# Patient Record
Sex: Male | Born: 1965 | Race: Black or African American | Hispanic: No | Marital: Single | Smoking: Never smoker
Health system: Southern US, Community
[De-identification: ages and names within clinical notes are randomized; demographics above are authoritative.]

## PROBLEM LIST (undated history)

## (undated) DIAGNOSIS — J45909 Unspecified asthma, uncomplicated: Secondary | ICD-10-CM

## (undated) DIAGNOSIS — T7840XA Allergy, unspecified, initial encounter: Secondary | ICD-10-CM

## (undated) DIAGNOSIS — F329 Major depressive disorder, single episode, unspecified: Secondary | ICD-10-CM

## (undated) DIAGNOSIS — F32A Depression, unspecified: Secondary | ICD-10-CM

## (undated) DIAGNOSIS — G43909 Migraine, unspecified, not intractable, without status migrainosus: Secondary | ICD-10-CM

## (undated) HISTORY — DX: Major depressive disorder, single episode, unspecified: F32.9

## (undated) HISTORY — PX: OTHER SURGICAL HISTORY: SHX169

## (undated) HISTORY — DX: Unspecified asthma, uncomplicated: J45.909

## (undated) HISTORY — DX: Depression, unspecified: F32.A

## (undated) HISTORY — DX: Allergy, unspecified, initial encounter: T78.40XA

## (undated) HISTORY — DX: Migraine, unspecified, not intractable, without status migrainosus: G43.909

## (undated) HISTORY — PX: ACHILLES TENDON REPAIR: SUR1153

---

## 2008-12-13 ENCOUNTER — Emergency Department (HOSPITAL_COMMUNITY): Admission: EM | Admit: 2008-12-13 | Discharge: 2008-12-13 | Payer: Self-pay | Admitting: Emergency Medicine

## 2009-12-06 ENCOUNTER — Ambulatory Visit: Payer: Self-pay | Admitting: Internal Medicine

## 2009-12-19 ENCOUNTER — Inpatient Hospital Stay: Payer: Self-pay | Admitting: Student

## 2010-01-06 ENCOUNTER — Ambulatory Visit: Payer: Self-pay | Admitting: Internal Medicine

## 2010-03-07 ENCOUNTER — Emergency Department (HOSPITAL_COMMUNITY): Admission: EM | Admit: 2010-03-07 | Discharge: 2010-03-07 | Payer: Self-pay | Admitting: Emergency Medicine

## 2010-09-14 IMAGING — CR DG ANKLE COMPLETE 3+V*R*
3 series · 3 of 3 positions shown · non-contrast
Comparison: None.

CLINICAL DATA: Right ankle pain.  Status post fall on stairs.

RIGHT ANKLE - COMPLETE 3+ VIEW

[t ankle joint ap right]
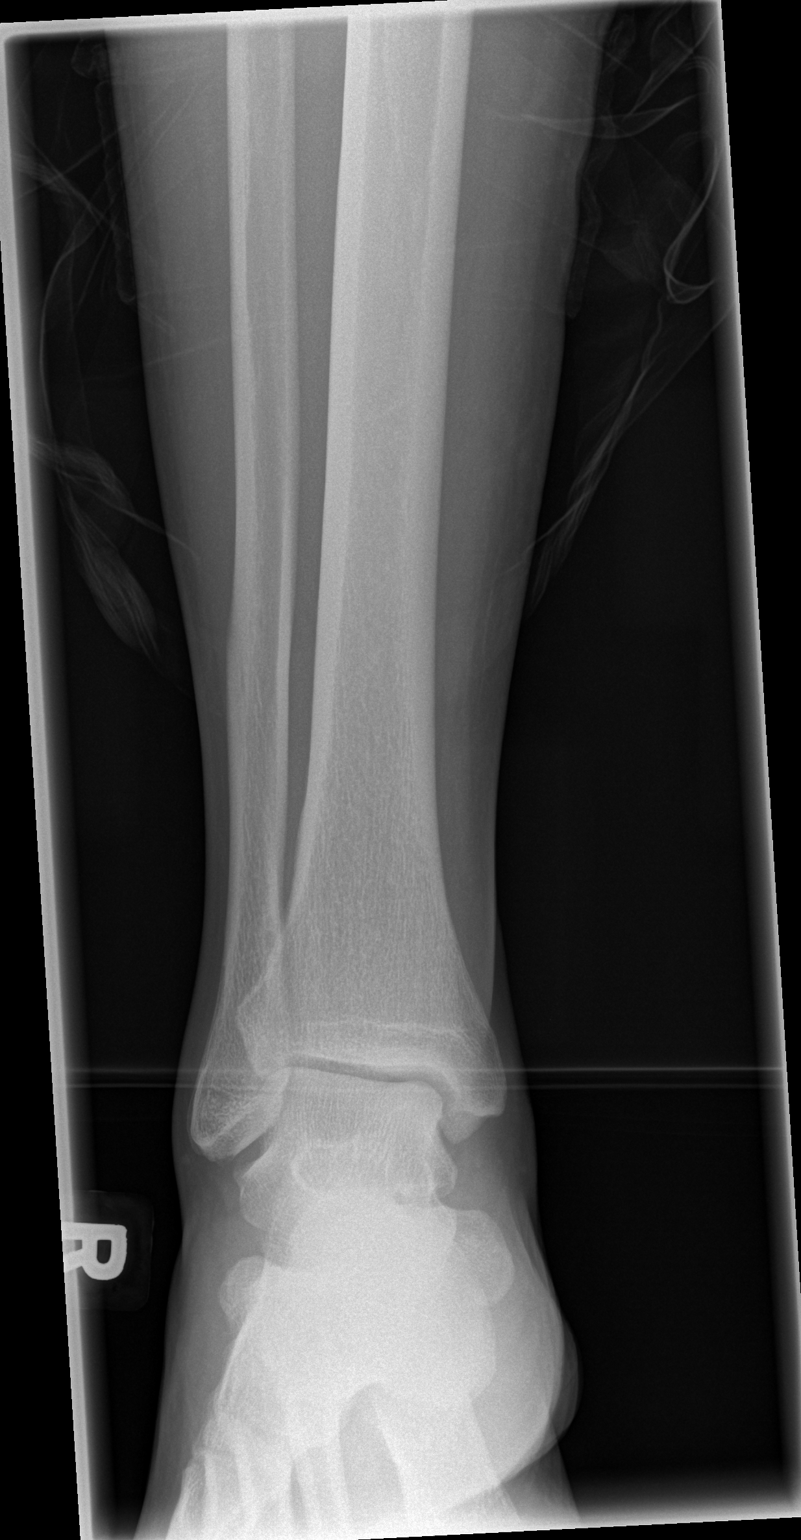

[t ankle joint oblique right]
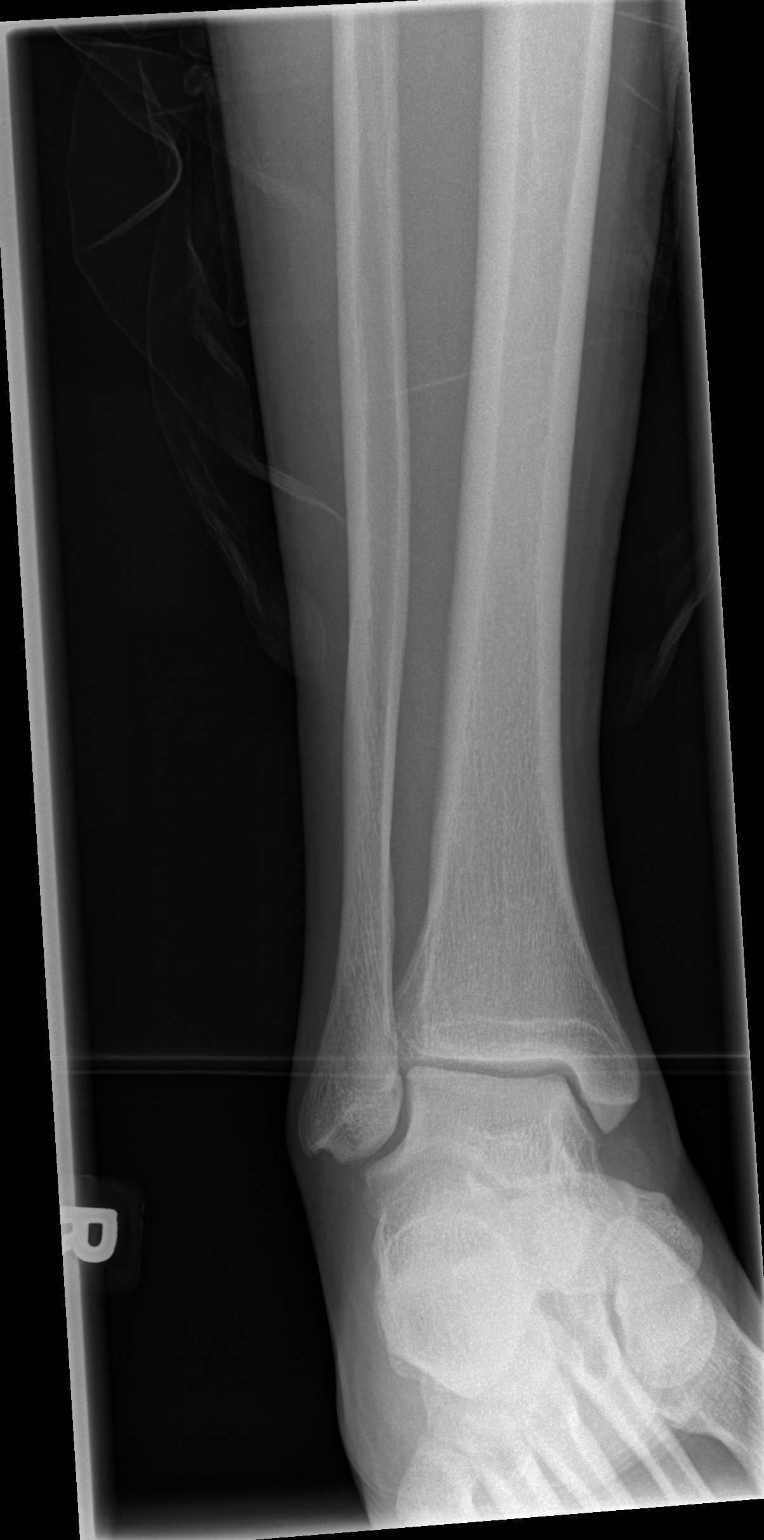

[t ankle joint lat right]
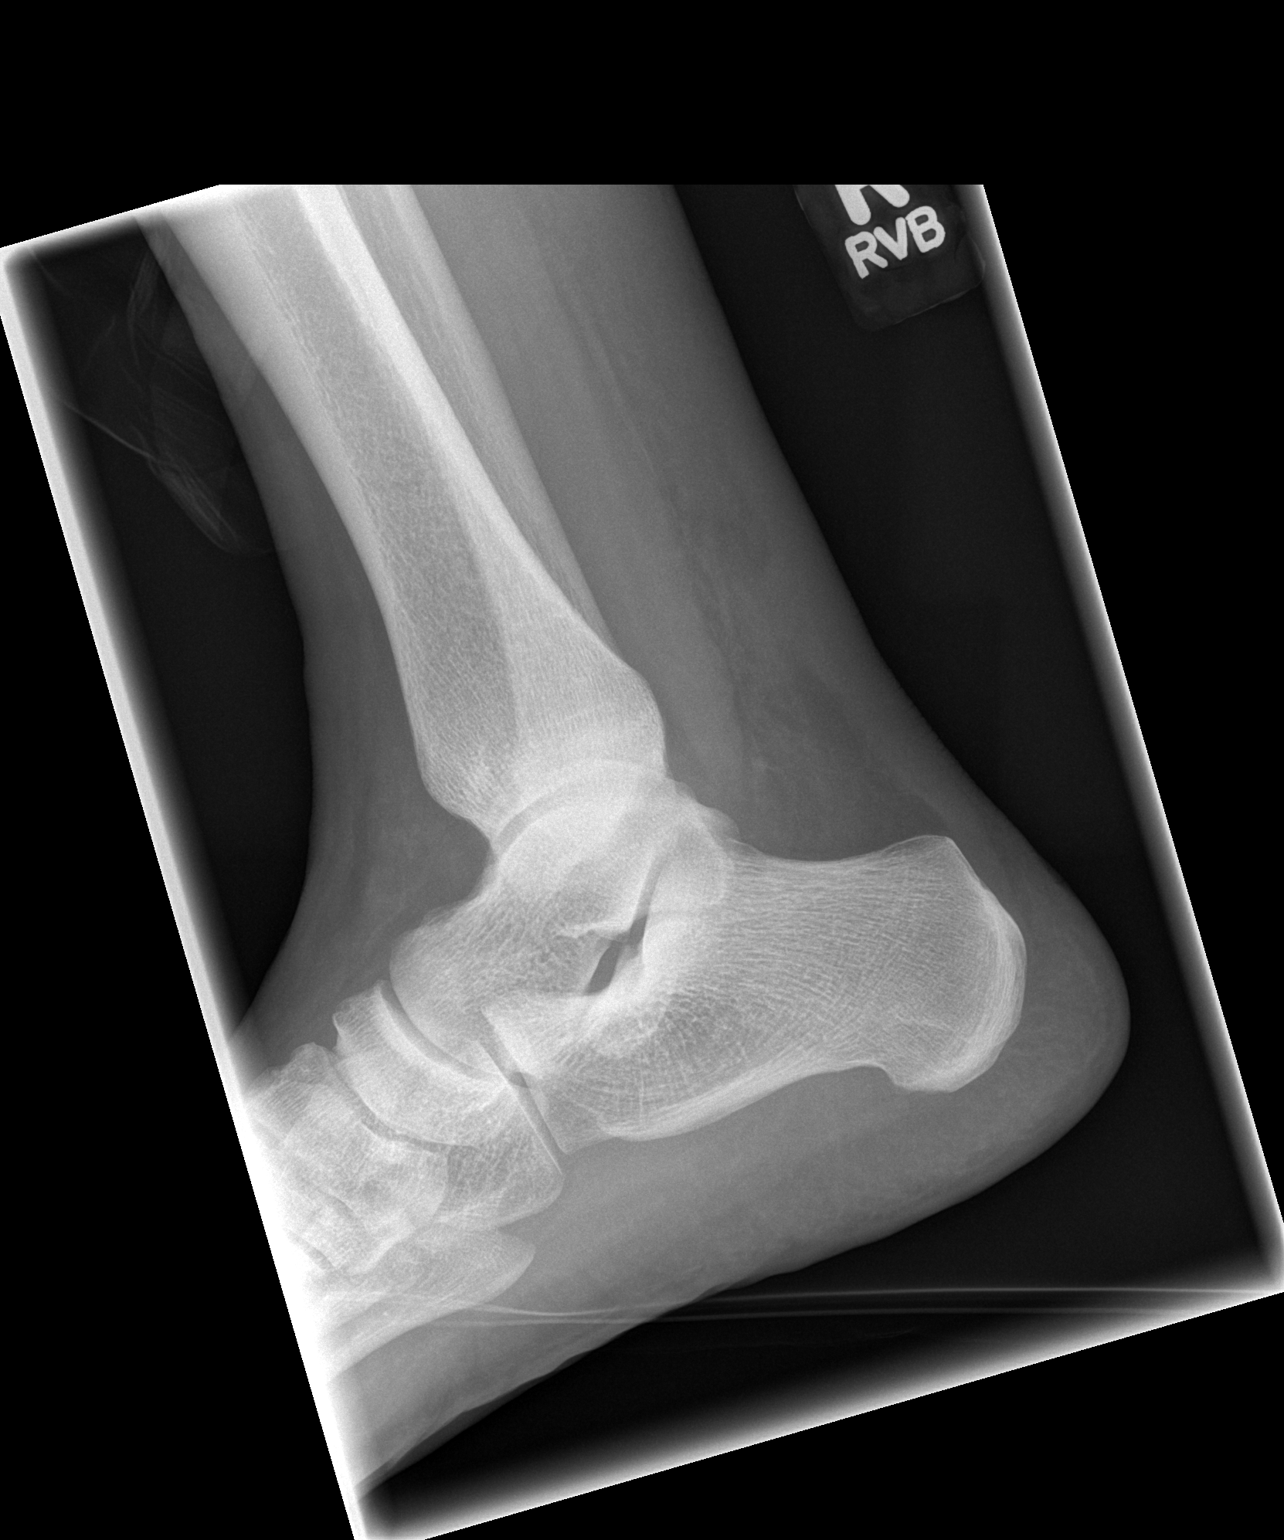

[3 of 3 positions shown; findings below may reference images not displayed]

FINDINGS: Mild soft tissue swelling is noted over the lateral
malleolus.  There is no underlying fracture.  The joint is intact.
No significant joint effusion is present.
IMPRESSION: Minimal soft tissue swelling over lateral malleolus without
underlying fracture or subluxation.

## 2012-11-24 ENCOUNTER — Ambulatory Visit (INDEPENDENT_AMBULATORY_CARE_PROVIDER_SITE_OTHER): Payer: 59 | Admitting: Emergency Medicine

## 2012-11-24 VITALS — BP 128/80 | HR 78 | Temp 97.9°F | Resp 16 | Ht 71.0 in | Wt 225.6 lb

## 2012-11-24 DIAGNOSIS — B001 Herpesviral vesicular dermatitis: Secondary | ICD-10-CM

## 2012-11-24 DIAGNOSIS — B009 Herpesviral infection, unspecified: Secondary | ICD-10-CM

## 2012-11-24 DIAGNOSIS — Z0000 Encounter for general adult medical examination without abnormal findings: Secondary | ICD-10-CM

## 2012-11-24 LAB — POCT URINALYSIS DIPSTICK
Glucose, UA: NEGATIVE
Ketones, UA: NEGATIVE
Leukocytes, UA: NEGATIVE
Spec Grav, UA: 1.01
Urobilinogen, UA: 0.2

## 2012-11-24 LAB — TSH: TSH: 1.299 u[IU]/mL (ref 0.350–4.500)

## 2012-11-24 LAB — COMPREHENSIVE METABOLIC PANEL
Alkaline Phosphatase: 57 U/L (ref 39–117)
CO2: 23 mEq/L (ref 19–32)
Calcium: 9.3 mg/dL (ref 8.4–10.5)
Glucose, Bld: 100 mg/dL — ABNORMAL HIGH (ref 70–99)
Potassium: 4.3 mEq/L (ref 3.5–5.3)
Sodium: 138 mEq/L (ref 135–145)

## 2012-11-24 LAB — POCT CBC
Lymph, poc: 1.7 (ref 0.6–3.4)
MID (cbc): 0.5 (ref 0–0.9)
POC LYMPH PERCENT: 42.7 %L (ref 10–50)
WBC: 3.9 10*3/uL — AB (ref 4.6–10.2)

## 2012-11-24 LAB — POCT UA - MICROSCOPIC ONLY
Casts, Ur, LPF, POC: NEGATIVE
Mucus, UA: NEGATIVE
RBC, urine, microscopic: NEGATIVE
WBC, Ur, HPF, POC: NEGATIVE

## 2012-11-24 LAB — IFOBT (OCCULT BLOOD): IFOBT: NEGATIVE

## 2012-11-24 LAB — LIPID PANEL
HDL: 56 mg/dL (ref >39)
Triglycerides: 64 mg/dL (ref <150)

## 2012-11-24 MED ORDER — VALACYCLOVIR HCL 1 G PO TABS: 2000.0000 mg | ORAL_TABLET | Freq: Two times a day (BID) | ORAL | Status: DC

## 2012-11-24 NOTE — Progress Notes (Signed)
[facility] [address], [place] [address] [phone]  Date:  11/24/2012   Name:  Shannon Mclean   DOB:  [birth year]   MRN:  [id number]  PCP:  No primary provider on file.    Chief Complaint: Annual Exam   History of Present Illness:  Shannon Mclean is a 46 y.o. very pleasant male patient who presents with the following:  History of migraine headaches with diminishing frequency now occuring weekly.  No improvement with maxalt and suffered adverse side effects of medication.  There is no problem list on file for this patient.   Past Medical History  Diagnosis Date  . Migraines     Past Surgical History  Procedure Date  . Achil   . Achilles tendon repair     History  Substance Use Topics  . Smoking status: Never Smoker   . Smokeless tobacco: Not on file  . Alcohol Use: 0.5 oz/week    1 drink(s) per week    Family History  Problem Relation Age of Onset  . Alzheimer's disease Mother   . Diabetes Father     No Known Allergies  Medication list has been reviewed and updated.  No current outpatient prescriptions on file prior to visit.    Review of Systems:  As per HPI, otherwise negative.    Physical Examination: Filed Vitals:   11/24/12 0821  BP: 128/80  Pulse: 78  Temp: 97.9 F (36.6 C)  Resp: 16   Filed Vitals:   11/24/12 0821  Height: 5\' 11"  (1.803 m)  Weight: 225 lb 9.6 oz (102.331 kg)   Body mass index is 31.46 kg/(m^2). Ideal Body Weight: Weight in (lb) to have BMI = 25: 178.9   GEN: WDWN, NAD, Non-toxic, A & O x 3 HEENT: Atraumatic, Normocephalic. Neck supple. No masses, No LAD. Ears and Nose: No external deformity. CV: RRR, No M/G/R. No JVD. No thrill. No extra heart sounds. PULM: CTA B, no wheezes, crackles, rhonchi. No retractions. No resp. distress. No accessory muscle use. ABD: S, NT, ND, +BS. No rebound. No HSM. EXTR: No c/c/e NEURO Normal gait.  PSYCH: Normally interactive. Conversant. Not  depressed or anxious appearing.  Calm demeanor.  RECTAL:  Normal male  Assessment and Plan: Wellness exam Migraine headaches controlled with NSAID Labs Follow up based on labs Refused flu shot  Carmelina Dane, MD  Results for orders placed in visit on 11/24/12  POCT CBC      Component Value Range   WBC 3.9 (*) 4.6 - 10.2 K/uL   Lymph, poc 1.7  0.6 - 3.4   POC LYMPH PERCENT 42.7  10 - 50 %L   MID (cbc) 0.5  0 - 0.9   POC MID % 12.1 (*) 0 - 12 %M   POC Granulocyte 1.8 (*) 2 - 6.9   Granulocyte percent 45.2  37 - 80 %G   RBC 5.47  4.69 - 6.13 M/uL   Hemoglobin 14.9  14.1 - 18.1 g/dL   HCT, POC 29.5  62.1 - 53.7 %   MCV 87.8  80 - 97 fL   MCH, POC 27.2  27 - 31.2 pg   MCHC 31.0 (*) 31.8 - 35.4 g/dL   RDW, POC 30.8     Platelet Count, POC 134 (*) 142 - 424 K/uL   MPV 12.2  0 - 99.8 fL  POCT UA - MICROSCOPIC ONLY      Component Value Range   WBC,  Ur, HPF, POC neg     RBC, urine, microscopic neg     Bacteria, U Microscopic neg     Mucus, UA neg     Epithelial cells, urine per micros rare     Crystals, Ur, HPF, POC neg     Casts, Ur, LPF, POC neg     Yeast, UA neg    POCT URINALYSIS DIPSTICK      Component Value Range   Color, UA yellow     Clarity, UA clear     Glucose, UA neg     Bilirubin, UA neg     Ketones, UA neg     Spec Grav, UA 1.010     Blood, UA trace-intact     pH, UA 7.0     Protein, UA neg     Urobilinogen, UA 0.2     Nitrite, UA neg     Leukocytes, UA Negative    IFOBT (OCCULT BLOOD)      Component Value Range   IFOBT Negative

## 2012-11-25 LAB — VITAMIN D 25 HYDROXY (VIT D DEFICIENCY, FRACTURES): Vit D, 25-Hydroxy: 21 ng/mL — ABNORMAL LOW (ref 30–89)

## 2012-11-27 MED ORDER — VITAMIN D (ERGOCALCIFEROL) 1.25 MG (50000 UNIT) PO CAPS: 50000.0000 [IU] | ORAL_CAPSULE | ORAL | Status: DC

## 2012-12-01 NOTE — Progress Notes (Signed)
Completed prior auth for pt's Vit D Rx and received approval from 11/30/12 - 11/30/13. Faxed approval notice to pharmacy.

## 2013-04-02 ENCOUNTER — Ambulatory Visit (INDEPENDENT_AMBULATORY_CARE_PROVIDER_SITE_OTHER): Payer: 59 | Admitting: Family Medicine

## 2013-04-02 VITALS — BP 128/92 | HR 63 | Temp 97.4°F | Resp 16 | Ht 72.0 in | Wt 232.0 lb

## 2013-04-02 DIAGNOSIS — B009 Herpesviral infection, unspecified: Secondary | ICD-10-CM

## 2013-04-02 DIAGNOSIS — J302 Other seasonal allergic rhinitis: Secondary | ICD-10-CM

## 2013-04-02 DIAGNOSIS — E559 Vitamin D deficiency, unspecified: Secondary | ICD-10-CM

## 2013-04-02 DIAGNOSIS — J309 Allergic rhinitis, unspecified: Secondary | ICD-10-CM

## 2013-04-02 MED ORDER — FLUTICASONE PROPIONATE 50 MCG/ACT NA SUSP: 2.0000 | Freq: Every day | NASAL | Status: DC

## 2013-04-02 MED ORDER — ALBUTEROL SULFATE HFA 108 (90 BASE) MCG/ACT IN AERS: 2.0000 | INHALATION_SPRAY | RESPIRATORY_TRACT | Status: DC | PRN

## 2013-04-02 MED ORDER — AZELASTINE HCL 0.1 % NA SOLN: 2.0000 | Freq: Two times a day (BID) | NASAL | Status: DC

## 2013-04-02 MED ORDER — LEVOCETIRIZINE DIHYDROCHLORIDE 5 MG PO TABS: 5.0000 mg | ORAL_TABLET | Freq: Every evening | ORAL | Status: DC

## 2013-04-02 NOTE — Patient Instructions (Addendum)
Try a Netti pot for the sinus rinse use nasal spray every day, will be 2 weeks for effect. Use inhaler as needed if you feel wheezing Allergic Rhinitis Allergic rhinitis is when the mucous membranes in the nose respond to allergens. Allergens are particles in the air that cause your body to have an allergic reaction. This causes you to release allergic antibodies. Through a chain of events, these eventually cause you to release histamine into the blood stream (hence the use of antihistamines). Although meant to be protective to the body, it is this release that causes your discomfort, such as frequent sneezing, congestion and an itchy runny nose.  CAUSES  The pollen allergens may come from grasses, trees, and weeds. This is seasonal allergic rhinitis, or "hay fever." Other allergens cause year-round allergic rhinitis (perennial allergic rhinitis) such as house dust mite allergen, pet dander and mold spores.  SYMPTOMS   Nasal stuffiness (congestion).  Runny, itchy nose with sneezing and tearing of the eyes.  There is often an itching of the mouth, eyes and ears. It cannot be cured, but it can be controlled with medications. DIAGNOSIS  If you are unable to determine the offending allergen, skin or blood testing may find it. TREATMENT   Avoid the allergen.  Medications and allergy shots (immunotherapy) can help.  Hay fever may often be treated with antihistamines in pill or nasal spray forms. Antihistamines block the effects of histamine. There are over-the-counter medicines that may help with nasal congestion and swelling around the eyes. Check with your caregiver before taking or giving this medicine. If the treatment above does not work, there are many new medications your caregiver can prescribe. Stronger medications may be used if initial measures are ineffective. Desensitizing injections can be used if medications and avoidance fails. Desensitization is when a patient is given ongoing shots  until the body becomes less sensitive to the allergen. Make sure you follow up with your caregiver if problems continue. SEEK MEDICAL CARE IF:   You develop fever (more than 100.5 F (38.1 C).  You develop a cough that does not stop easily (persistent).  You have shortness of breath.  You start wheezing.  Symptoms interfere with normal daily activities. Document Released: 08/17/2001 Document Revised: 02/14/2012 Document Reviewed: 02/26/2009 [facility] Patient Information 2013 [place], [place].

## 2013-04-02 NOTE — Progress Notes (Signed)
  Subjective:    Patient ID: Shannon Mclean, male    DOB: [birth year], 47 y.o.   MRN: [id number]   Chief Complaint  Patient presents with  . Allergies    HPI Works outside so taking claritin-D and zyrtec but not helping.  Having itchy watery eyes, sneezing, wheezing, can't breath though nose or mouth.  No throat or ear pain, is having sinus pressure.  Does saline rinses at times w/o benefit.    Review of Systems    BP 128/92  Pulse 63  Temp(Src) 97.4 F (36.3 C) (Oral)  Resp 16  Ht 6' (1.829 m)  Wt 232 lb (105.235 kg)  BMI 31.46 kg/m2  SpO2 100%predicted peak flow is 640 Objective:   Physical Exam        Assessment & Plan:

## 2013-04-03 LAB — VITAMIN D 25 HYDROXY (VIT D DEFICIENCY, FRACTURES): Vit D, 25-Hydroxy: 37 ng/mL (ref 30–89)

## 2013-06-29 ENCOUNTER — Other Ambulatory Visit: Payer: Self-pay

## 2013-06-29 MED ORDER — LEVOCETIRIZINE DIHYDROCHLORIDE 5 MG PO TABS: 5.0000 mg | ORAL_TABLET | Freq: Every evening | ORAL | Status: DC

## 2013-07-27 ENCOUNTER — Other Ambulatory Visit: Payer: Self-pay | Admitting: Family Medicine

## 2014-02-20 ENCOUNTER — Ambulatory Visit: Payer: 59

## 2014-02-20 ENCOUNTER — Ambulatory Visit (INDEPENDENT_AMBULATORY_CARE_PROVIDER_SITE_OTHER): Payer: 59 | Admitting: Family Medicine

## 2014-02-20 VITALS — BP 142/92 | HR 58 | Temp 98.3°F | Resp 17 | Ht 71.0 in | Wt 238.0 lb

## 2014-02-20 DIAGNOSIS — Z8619 Personal history of other infectious and parasitic diseases: Secondary | ICD-10-CM

## 2014-02-20 DIAGNOSIS — R1013 Epigastric pain: Secondary | ICD-10-CM

## 2014-02-20 DIAGNOSIS — K5900 Constipation, unspecified: Secondary | ICD-10-CM

## 2014-02-20 DIAGNOSIS — R110 Nausea: Secondary | ICD-10-CM

## 2014-02-20 DIAGNOSIS — R109 Unspecified abdominal pain: Secondary | ICD-10-CM

## 2014-02-20 LAB — POCT UA - MICROSCOPIC ONLY
Bacteria, U Microscopic: NEGATIVE
Casts, Ur, LPF, POC: NEGATIVE
Epithelial cells, urine per micros: NEGATIVE
Mucus, UA: NEGATIVE
RBC, urine, microscopic: NEGATIVE
WBC, Ur, HPF, POC: NEGATIVE
Yeast, UA: NEGATIVE

## 2014-02-20 LAB — POCT URINALYSIS DIPSTICK
Bilirubin, UA: NEGATIVE
Blood, UA: NEGATIVE
Glucose, UA: NEGATIVE
Ketones, UA: NEGATIVE
Leukocytes, UA: NEGATIVE
Nitrite, UA: NEGATIVE
Protein, UA: NEGATIVE
Spec Grav, UA: 1.015
Urobilinogen, UA: 0.2
pH, UA: 7

## 2014-02-20 LAB — POCT CBC
Granulocyte percent: 49.2 %G (ref 37–80)
HCT, POC: 47.8 % (ref 43.5–53.7)
Hemoglobin: 15.3 g/dL (ref 14.1–18.1)
Lymph, poc: 1.8 (ref 0.6–3.4)
MCH, POC: 27.6 pg (ref 27–31.2)
MCHC: 32 g/dL (ref 31.8–35.4)
MCV: 86.2 fL (ref 80–97)
MID (cbc): 0.4 (ref 0–0.9)
MPV: 15.5 fL (ref 0–99.8)
POC Granulocyte: 2.1 (ref 2–6.9)
POC LYMPH PERCENT: 41.9 %L (ref 10–50)
POC MID %: 8.9 %M (ref 0–12)
Platelet Count, POC: 139 10*3/uL — AB (ref 142–424)
RBC: 5.54 M/uL (ref 4.69–6.13)
RDW, POC: 14.7 %
WBC: 4.2 10*3/uL — AB (ref 4.6–10.2)

## 2014-02-20 MED ORDER — VALACYCLOVIR HCL 1 G PO TABS: 2000.0000 mg | ORAL_TABLET | Freq: Two times a day (BID) | ORAL | Status: DC

## 2014-02-20 NOTE — Progress Notes (Signed)
Chief Complaint:  Chief Complaint  Patient presents with  . Abdominal Pain  . Nausea  . Fatigue  . Medication Refill    valtrex    HPI:No  Shannon Mclean is a 48 y.o. male who is here for abdominal pain in the AM but has gotten more intense and lasting longers, 5-15 min last 12 days. Yesterday about 9/10 pain, stabbing pain and felt nauseated. Had to leave work. He thought it was because he was drinking oragne juice. He feels like he has to go to the bathroom. He felt it before and nothing happens. He]'  had a BM after work. No diarrhea or loose stools, no blood. Normal BM yesterday.  No fevers or chills. Has had fluids, food makes it better. No fevers, no vomiting. No chills. No untintentional weightloss. No night sweats. No acute back pain or urianry sxs.  He has been taking naproxen for HAs everyday. Everyday he has slight HA. He has been taking it regular.  He feels tired. He has been taking caffeine pills. 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  Does have some straining with BM  Past Medical History  Diagnosis Date  . Migraines   . Allergy    Past Surgical History  Procedure Laterality Date  . Achil    . Achilles tendon repair     History   Social History  . Marital Status: Single    Spouse Name: N/A    Number of Children: N/A  . Years of Education: N/A   Social History Main Topics  . Smoking status: Never Smoker   . Smokeless tobacco: None  . Alcohol Use: 0.5 oz/week    1 drink(s) per week  . Drug Use: No  . Sexual Activity: No   Other Topics Concern  . None   Social History Narrative  . None   Family History  Problem Relation Age of Onset  . Alzheimer's disease Mother   . Diabetes Father    No Known Allergies Prior to Admission medications   Medication Sig Start Date End Date Taking? Authorizing Provider  naproxen (NAPROSYN) 500 MG tablet Take 500 mg by mouth 2 (two) times daily with a meal.   Yes Historical Provider, MD  valACYclovir  (VALTREX) 1000 MG tablet Take 2 tablets (2,000 mg total) by mouth 2 (two) times daily. 11/24/12  Yes Phillips Odor, MD     ROS: The patient denies fevers, chills, night sweats, unintentional weight loss, chest pain, palpitations, wheezing, dyspnea on exertion,  vomiting, dysuria, hematuria, melena, numbness, weakness, or tingling.   All other systems have been reviewed and were otherwise negative with the exception of those mentioned in the HPI and as above.    PHYSICAL EXAM: Filed Vitals:   02/20/14 1118  BP: 142/92  Pulse: 58  Temp: 98.3 F (36.8 C)  Resp: 17   Filed Vitals:   02/20/14 1118  Height: 5\' 11"  (1.803 m)  Weight: 238 lb (107.956 kg)   Body mass index is 33.21 kg/(m^2).  General: Alert, no acute distress HEENT:  Normocephalic, atraumatic, oropharynx patent. EOMI, PERRLA, Tm nl, no exudates Cardiovascular:  Regular rate and rhythm, no rubs murmurs or gallops.  No Carotid bruits, radial pulse intact. No pedal edema.  Respiratory: Clear to auscultation bilaterally.  No wheezes, rales, or rhonchi.  No cyanosis, no use of accessory musculature GI: No organomegaly, abdomen is soft and non-tender, positive bowel sounds.  No masses. Skin: No rashes. Neurologic: Facial musculature  symmetric. Psychiatric: Patient is appropriate throughout our interaction. Lymphatic: No cervical lymphadenopathy Musculoskeletal: Gait intact.   LABS: Results for orders placed in visit on 02/20/14  POCT CBC      Result Value Ref Range   WBC 4.2 (*) 4.6 - 10.2 K/uL   Lymph, poc 1.8  0.6 - 3.4   POC LYMPH PERCENT 41.9  10 - 50 %L   MID (cbc) 0.4  0 - 0.9   POC MID % 8.9  0 - 12 %M   POC Granulocyte 2.1  2 - 6.9   Granulocyte percent 49.2  37 - 80 %G   RBC 5.54  4.69 - 6.13 M/uL   Hemoglobin 15.3  14.1 - 18.1 g/dL   HCT, POC 16.147.8  09.643.5 - 53.7 %   MCV 86.2  80 - 97 fL   MCH, POC 27.6  27 - 31.2 pg   MCHC 32.0  31.8 - 35.4 g/dL   RDW, POC 04.514.7     Platelet Count, POC 139 (*) 142 -  424 K/uL   MPV 15.5  0 - 99.8 fL  POCT UA - MICROSCOPIC ONLY      Result Value Ref Range   WBC, Ur, HPF, POC neg     RBC, urine, microscopic neg     Bacteria, U Microscopic neg     Mucus, UA neg     Epithelial cells, urine per micros neg     Crystals, Ur, HPF, POC acid     Casts, Ur, LPF, POC neg     Yeast, UA neg    POCT URINALYSIS DIPSTICK      Result Value Ref Range   Color, UA lt yellow     Clarity, UA clear     Glucose, UA neg     Bilirubin, UA neg     Ketones, UA neg     Spec Grav, UA 1.015     Blood, UA neg     pH, UA 7.0     Protein, UA neg     Urobilinogen, UA 0.2     Nitrite, UA neg     Leukocytes, UA Negative       EKG/XRAY:   [facility] No free air, no obstruction Mod stool burden Nonspecific gas patterns Chest is normal   ASSESSMENT/PLAN: Encounter Diagnoses  Name Primary?  . Abdominal pain, epigastric Yes  . Nausea alone   . Abdominal pain   . Unspecified constipation   . H/O cold sores    ? Constipation vs PUD/GERD/gastritis GERD precautions given Prune juice try miralax Nexium samples given Labs pending; H pylori, CMP and lipase.  F/u prn or ER   Gross sideeffects, risk and benefits, and alternatives of medications d/w patient. Patient is aware that all medications have potential sideeffects and we are unable to predict every sideeffect or drug-drug interaction that may occur.  Jachin Coury PHUONG, DO 02/20/2014 1:53 PM

## 2014-02-21 LAB — COMPREHENSIVE METABOLIC PANEL
ALT: 15 U/L (ref 0–53)
AST: 17 U/L (ref 0–37)
Albumin: 4.7 g/dL (ref 3.5–5.2)
Alkaline Phosphatase: 65 U/L (ref 39–117)
BUN: 12 mg/dL (ref 6–23)
Chloride: 101 mEq/L (ref 96–112)
Creat: 1.02 mg/dL (ref 0.50–1.35)
Glucose, Bld: 95 mg/dL (ref 70–99)
Sodium: 136 mEq/L (ref 135–145)
Total Bilirubin: 0.6 mg/dL (ref 0.2–1.2)

## 2014-02-21 LAB — COMPREHENSIVE METABOLIC PANEL WITH GFR
CO2: 28 meq/L (ref 19–32)
Calcium: 9.6 mg/dL (ref 8.4–10.5)
Potassium: 4.8 meq/L (ref 3.5–5.3)
Total Protein: 7 g/dL (ref 6.0–8.3)

## 2014-02-21 LAB — HELICOBACTER PYLORI  ANTIBODY, IGM: Helicobacter pylori, IgM: 0.9 U/mL (ref <9.0)

## 2014-02-21 LAB — LIPASE: Lipase: 11 U/L (ref 0–75)

## 2014-03-01 ENCOUNTER — Ambulatory Visit (INDEPENDENT_AMBULATORY_CARE_PROVIDER_SITE_OTHER): Payer: 59 | Admitting: Physician Assistant

## 2014-03-01 ENCOUNTER — Encounter: Payer: Self-pay | Admitting: Physician Assistant

## 2014-03-01 VITALS — BP 122/76 | HR 69 | Temp 98.0°F | Resp 16 | Ht 70.5 in | Wt 238.6 lb

## 2014-03-01 DIAGNOSIS — J309 Allergic rhinitis, unspecified: Secondary | ICD-10-CM

## 2014-03-01 DIAGNOSIS — J302 Other seasonal allergic rhinitis: Secondary | ICD-10-CM

## 2014-03-01 DIAGNOSIS — J45909 Unspecified asthma, uncomplicated: Secondary | ICD-10-CM | POA: Insufficient documentation

## 2014-03-01 MED ORDER — LEVOCETIRIZINE DIHYDROCHLORIDE 5 MG PO TABS: 5.0000 mg | ORAL_TABLET | Freq: Every evening | ORAL | Status: DC

## 2014-03-01 MED ORDER — ALBUTEROL SULFATE HFA 108 (90 BASE) MCG/ACT IN AERS: 2.0000 | INHALATION_SPRAY | Freq: Four times a day (QID) | RESPIRATORY_TRACT | Status: DC | PRN

## 2014-03-01 MED ORDER — AZELASTINE HCL 0.1 % NA SOLN: 2.0000 | Freq: Two times a day (BID) | NASAL | Status: AC

## 2014-03-01 MED ORDER — FLUTICASONE PROPIONATE 50 MCG/ACT NA SUSP: 2.0000 | Freq: Every day | NASAL | Status: DC

## 2014-03-01 MED ORDER — BECLOMETHASONE DIPROPIONATE 80 MCG/ACT IN AERS: 1.0000 | INHALATION_SPRAY | Freq: Two times a day (BID) | RESPIRATORY_TRACT | Status: DC

## 2014-03-01 NOTE — Patient Instructions (Addendum)
We are looking at controlling your asthma better -we will start a QVAR inhaler that you will use daily - your will use the proair as needed and hope that you will not need it as often once the Qvar is started - our goal is for you to need the proair less than 2x/week during the day and less than twice a month at night.  For heartburn/ingestion/stomach pain - OTC Zantac 150mg  2x/day

## 2014-03-01 NOTE — Progress Notes (Signed)
   Subjective:    Patient ID: Shannon Mclean, male    DOB: [birth year], 48 y.o.   MRN: [id number]  HPI Pt presents to clinic for med refill.  He has significant allergies and is currently out of medications and wants to get started before allergy season gets bad.  He already uses the Pro-air twice daily.  He uses it when he feels SOB or wheezes.  He would like to know about his labs from his last visit with his epigastric pain which has gotten better and almost resolved.  He takes Naproxen daily for his migraines - he used to go see a neurologist in [place] but has not been seen there since 2011 - he had been on multiple meds for migraines and he feels like they have made the HAs worse - he takes the naproxen with food every time.  His HAs have no gotten worse while taking the Naproxen daily.  Review of Systems  Constitutional: Negative for fever and chills.  Respiratory: Positive for shortness of breath and wheezing.   Allergic/Immunologic: Positive for environmental allergies.      Objective:   Physical Exam  Vitals reviewed. Constitutional: He is oriented to person, place, and time. He appears well-developed and well-nourished.  HENT:  Head: Normocephalic and atraumatic.  Right Ear: Hearing, tympanic membrane, external ear and ear canal normal.  Left Ear: Hearing, tympanic membrane, external ear and ear canal normal.  Nose: Mucosal edema present.  Mouth/Throat: Uvula is midline, oropharynx is clear and moist and mucous membranes are normal.  Cardiovascular: Normal rate, regular rhythm and normal heart sounds.   No murmur heard. Pulmonary/Chest: Effort normal. He has wheezes (end expiration with forced expiration).  Neurological: He is alert and oriented to person, place, and time.  Skin: Skin is warm and dry.  Psychiatric: He has a normal mood and affect. His behavior is normal. Judgment and thought content normal.       Assessment & Plan:  Seasonal allergies - Plan: levocetirizine  (XYZAL) 5 MG tablet, fluticasone (FLONASE) 50 MCG/ACT nasal spray, azelastine (ASTELIN) 137 MCG/SPRAY nasal spray  Extrinsic asthma, unspecified - Plan: albuterol (PROVENTIL HFA;VENTOLIN HFA) 108 (90 BASE) MCG/ACT inhaler, beclomethasone (QVAR) 80 MCG/ACT inhaler  With the patient's use of albuterol daily we added Qvar and our goal is to have him need the albuterol less than 2x/wk during the day.  He will check in 1 month to see his response to the Qvar and to see if his use of albuterol has reduced.  We discussed his daily use of Naproxen on both his daily headaches and his possible gastritis.  If his HAs are worsening he will need to see a neurologist for further HA evaluation.  Benny LennertSarah Kandance Yano PA-C  [facility] 03/01/2014 3:11 PM

## 2014-03-08 NOTE — Progress Notes (Signed)
Left message for patient to call back to schedule follow up with Shannon Mclean.

## 2014-06-21 ENCOUNTER — Other Ambulatory Visit: Payer: Self-pay | Admitting: Physician Assistant

## 2014-08-17 ENCOUNTER — Other Ambulatory Visit: Payer: Self-pay | Admitting: Physician Assistant

## 2014-08-19 NOTE — Telephone Encounter (Signed)
LMOM to CB. Pt was to RTC for re-check in 1 mos after March OV, and note put on last RF. Find out when pt can come in.

## 2014-08-30 IMAGING — CR DG ABDOMEN ACUTE W/ 1V CHEST
3 series · 3 of 3 positions shown · non-contrast
Comparison: None.

CLINICAL DATA: Abdominal pain

EXAM:
ACUTE ABDOMEN SERIES (ABDOMEN 2 VIEW & CHEST 1 VIEW)

[PA]
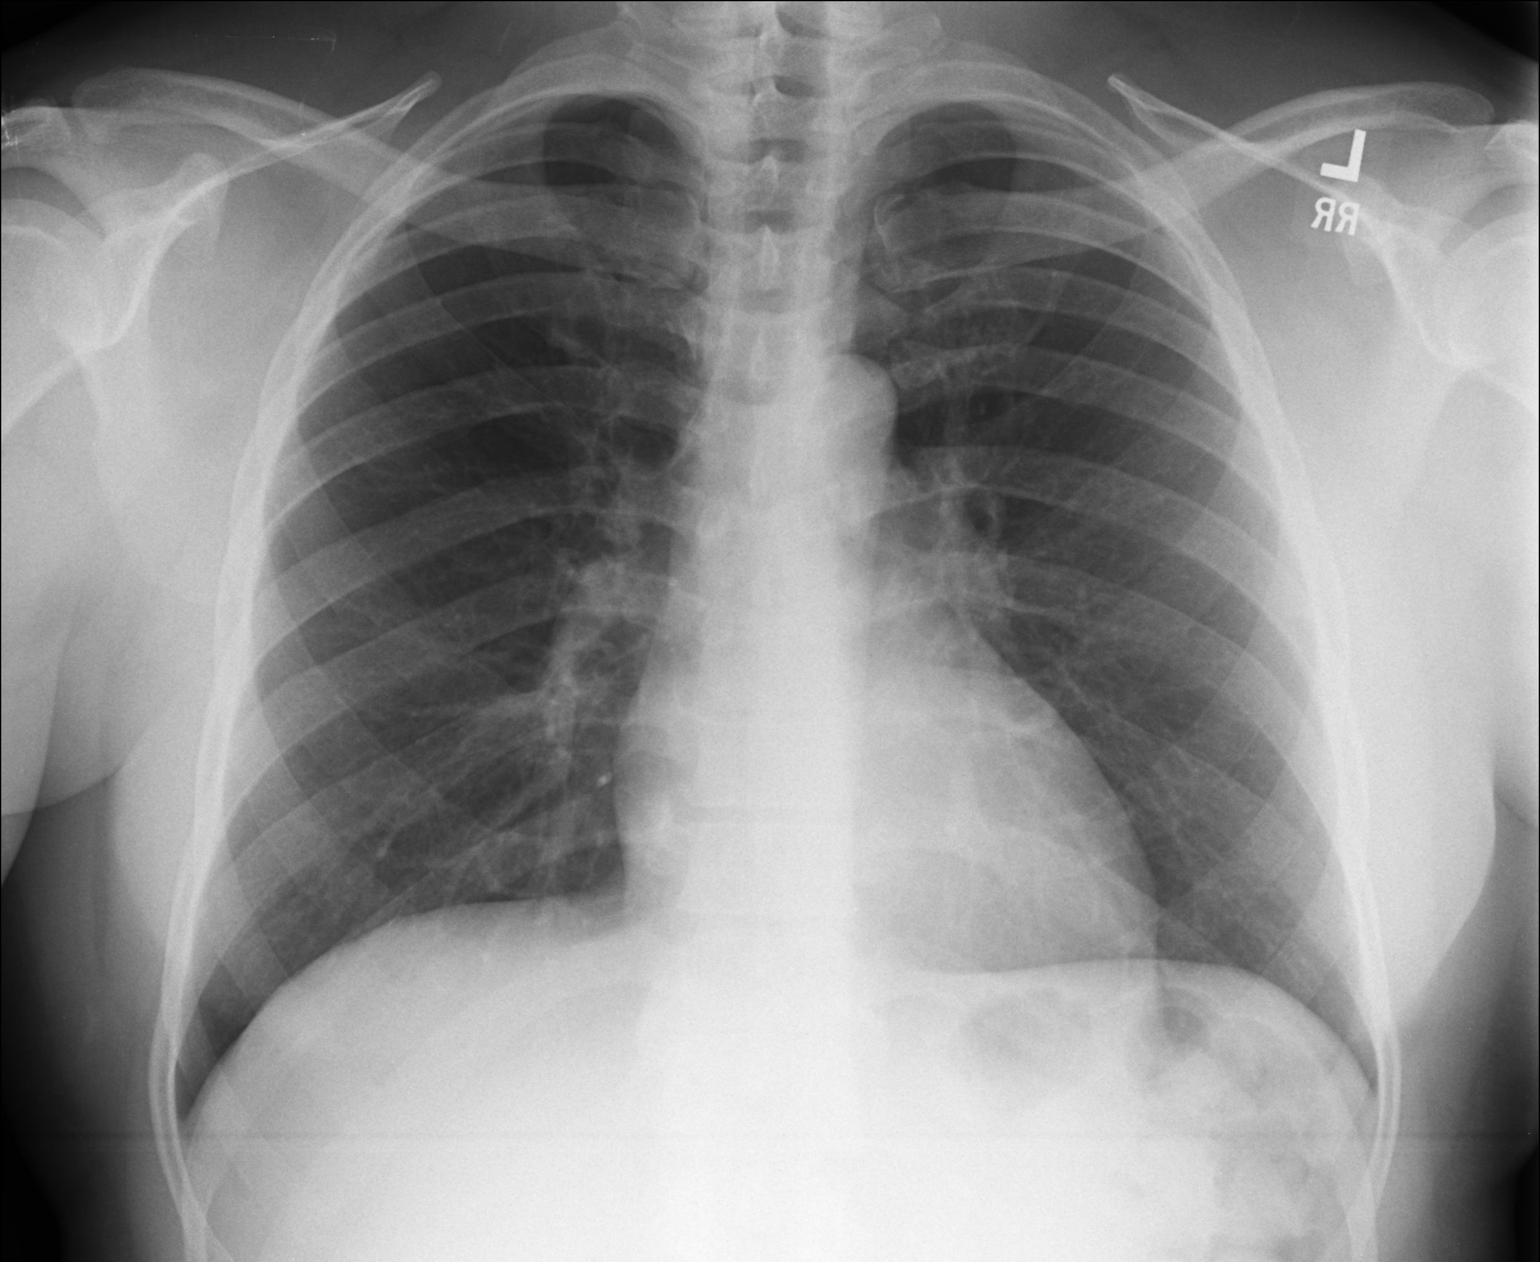

[AP (1 of 2)]
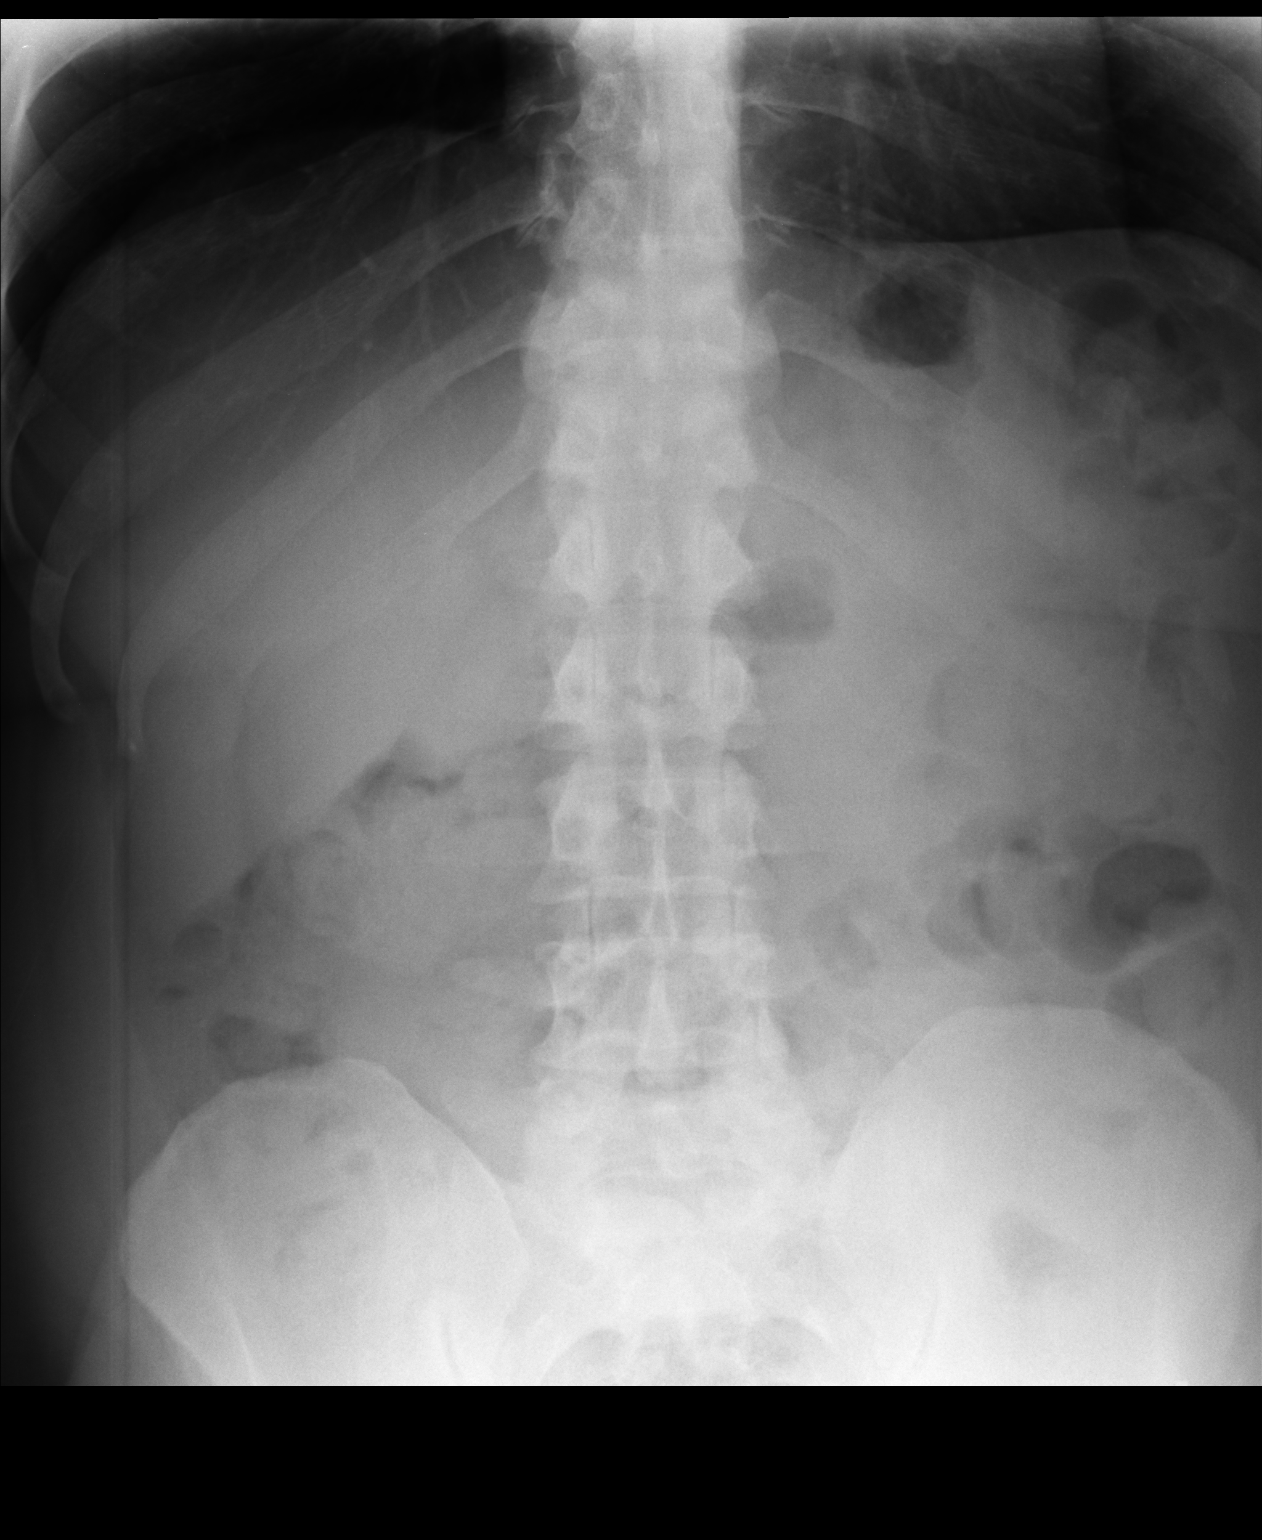

[AP (2 of 2)]
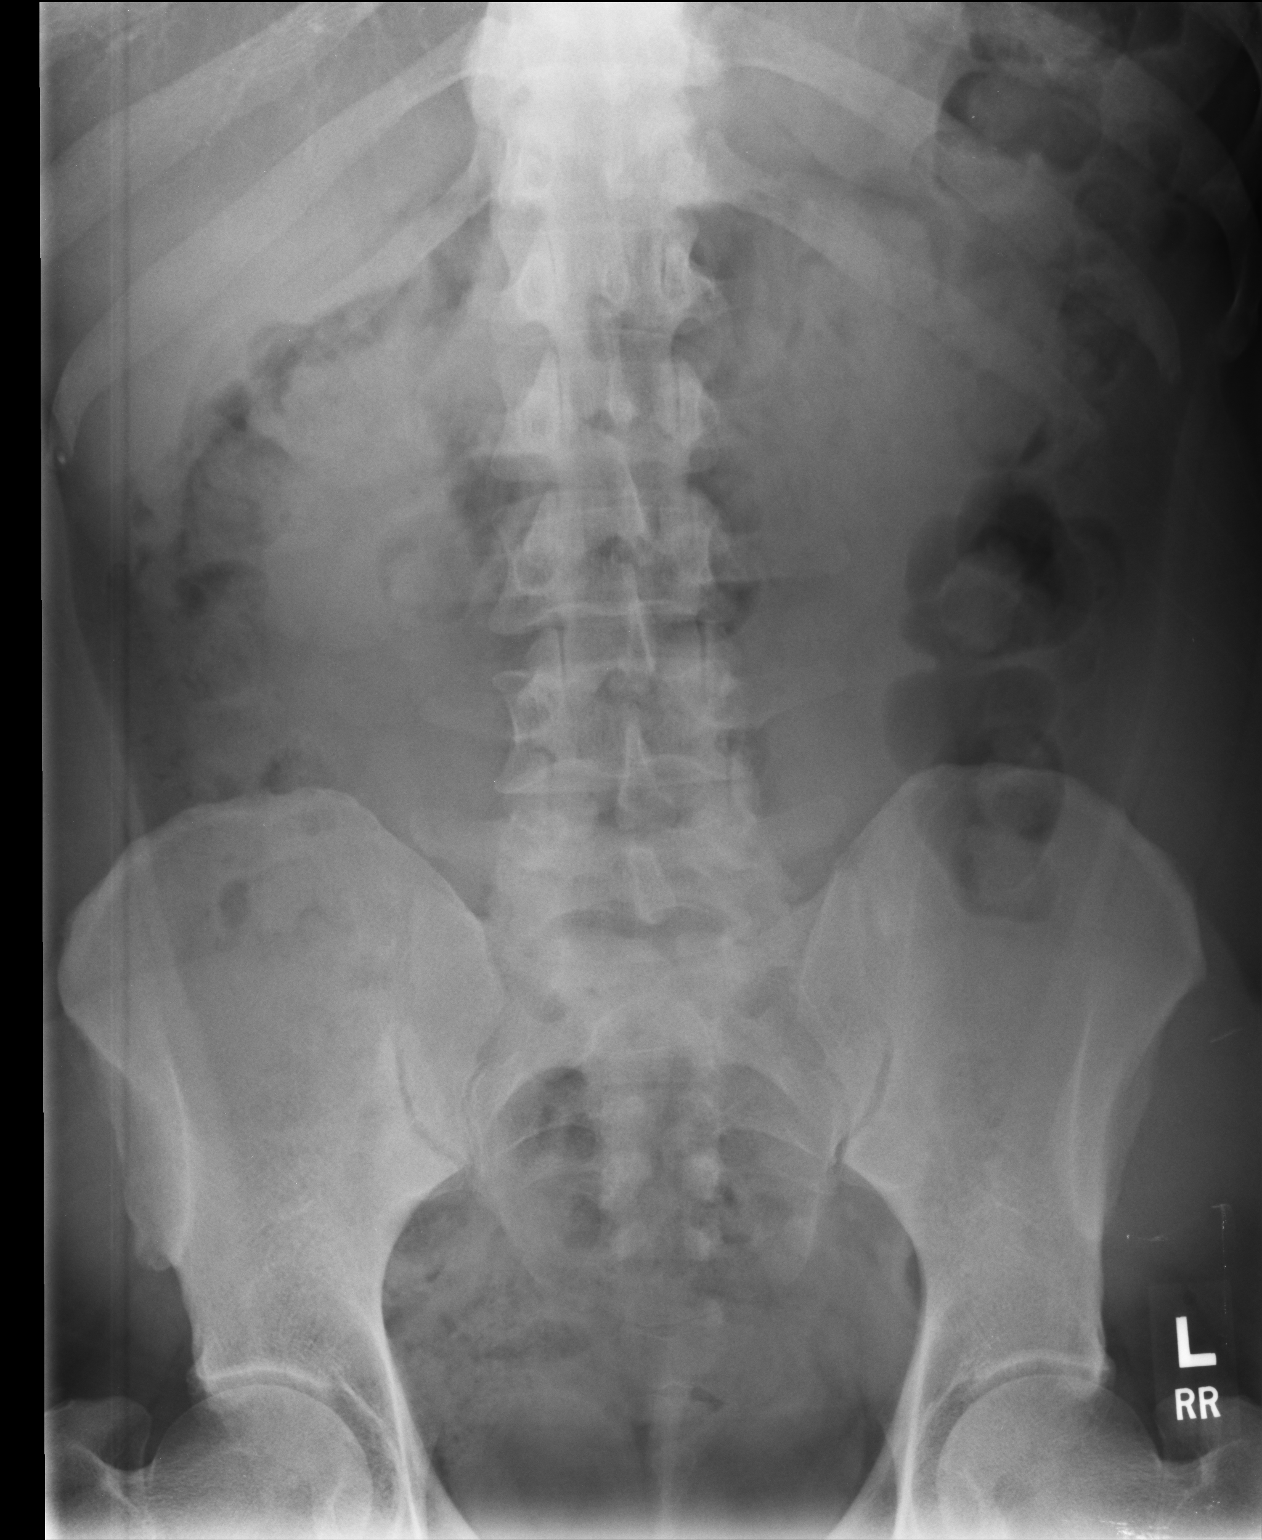

[3 of 3 positions shown; findings below may reference images not displayed]

FINDINGS: There is no evidence of dilated bowel loops or free intraperitoneal
air. No radiopaque calculi or other significant radiographic
abnormality is seen. Heart size and mediastinal contours are within
normal limits. Both lungs are clear.
IMPRESSION: Negative abdominal radiographs.  No acute cardiopulmonary disease.

## 2014-09-17 ENCOUNTER — Ambulatory Visit (INDEPENDENT_AMBULATORY_CARE_PROVIDER_SITE_OTHER): Payer: 59 | Admitting: Physician Assistant

## 2014-09-17 VITALS — BP 146/80 | HR 78 | Temp 97.4°F | Resp 16 | Ht 71.0 in | Wt 235.8 lb

## 2014-09-17 DIAGNOSIS — R3912 Poor urinary stream: Secondary | ICD-10-CM

## 2014-09-17 DIAGNOSIS — R413 Other amnesia: Secondary | ICD-10-CM

## 2014-09-17 DIAGNOSIS — Z13220 Encounter for screening for lipoid disorders: Secondary | ICD-10-CM

## 2014-09-17 DIAGNOSIS — N400 Enlarged prostate without lower urinary tract symptoms: Secondary | ICD-10-CM

## 2014-09-17 DIAGNOSIS — Z0000 Encounter for general adult medical examination without abnormal findings: Secondary | ICD-10-CM

## 2014-09-17 DIAGNOSIS — F418 Other specified anxiety disorders: Secondary | ICD-10-CM | POA: Insufficient documentation

## 2014-09-17 DIAGNOSIS — Z113 Encounter for screening for infections with a predominantly sexual mode of transmission: Secondary | ICD-10-CM

## 2014-09-17 DIAGNOSIS — J4520 Mild intermittent asthma, uncomplicated: Secondary | ICD-10-CM

## 2014-09-17 LAB — POCT UA - MICROSCOPIC ONLY
Bacteria, U Microscopic: NEGATIVE
CASTS, UR, LPF, POC: NEGATIVE
Crystals, Ur, HPF, POC: NEGATIVE
EPITHELIAL CELLS, URINE PER MICROSCOPY: NEGATIVE
Mucus, UA: NEGATIVE
RBC, URINE, MICROSCOPIC: NEGATIVE
WBC, UR, HPF, POC: NEGATIVE
Yeast, UA: NEGATIVE

## 2014-09-17 LAB — POCT URINALYSIS DIPSTICK
Bilirubin, UA: NEGATIVE
Blood, UA: NEGATIVE
Glucose, UA: NEGATIVE
KETONES UA: NEGATIVE
LEUKOCYTES UA: NEGATIVE
Nitrite, UA: NEGATIVE
Protein, UA: NEGATIVE
SPEC GRAV UA: 1.02
Urobilinogen, UA: 0.2
pH, UA: 5.5

## 2014-09-17 LAB — COMPREHENSIVE METABOLIC PANEL
ALBUMIN: 4.8 g/dL (ref 3.5–5.2)
ALT: 27 U/L (ref 0–53)
AST: 66 U/L — ABNORMAL HIGH (ref 0–37)
Alkaline Phosphatase: 58 U/L (ref 39–117)
BILIRUBIN TOTAL: 0.4 mg/dL (ref 0.2–1.2)
BUN: 15 mg/dL (ref 6–23)
CALCIUM: 9.8 mg/dL (ref 8.4–10.5)
CHLORIDE: 101 meq/L (ref 96–112)
CO2: 26 meq/L (ref 19–32)
Creat: 1.23 mg/dL (ref 0.50–1.35)
Glucose, Bld: 92 mg/dL (ref 70–99)
POTASSIUM: 4.8 meq/L (ref 3.5–5.3)
Sodium: 140 mEq/L (ref 135–145)
Total Protein: 7.2 g/dL (ref 6.0–8.3)

## 2014-09-17 LAB — POCT CBC
Granulocyte percent: 60.6 %G (ref 37–80)
HCT, POC: 47.4 % (ref 43.5–53.7)
HEMOGLOBIN: 15.1 g/dL (ref 14.1–18.1)
Lymph, poc: 2.2 (ref 0.6–3.4)
MCH, POC: 27.5 pg (ref 27–31.2)
MCHC: 31.9 g/dL (ref 31.8–35.4)
MCV: 86.2 fL (ref 80–97)
MID (cbc): 0.3 (ref 0–0.9)
MPV: 9.9 fL (ref 0–99.8)
POC GRANULOCYTE: 3.89 (ref 2–6.9)
POC LYMPH PERCENT: 34.2 %L (ref 10–50)
POC MID %: 5.2 %M (ref 0–12)
Platelet Count, POC: 128 10*3/uL — AB (ref 142–424)
RBC: 5.5 M/uL (ref 4.69–6.13)
RDW, POC: 14.9 %
WBC: 6.4 10*3/uL (ref 4.6–10.2)

## 2014-09-17 LAB — LIPID PANEL
CHOL/HDL RATIO: 4.6 ratio
Cholesterol: 247 mg/dL — ABNORMAL HIGH (ref 0–200)
HDL: 54 mg/dL (ref >39)
LDL CALC: 167 mg/dL — AB (ref 0–99)
Triglycerides: 132 mg/dL (ref <150)
VLDL: 26 mg/dL (ref 0–40)

## 2014-09-17 LAB — TSH: TSH: 1.263 u[IU]/mL (ref 0.350–4.500)

## 2014-09-17 MED ORDER — ALBUTEROL SULFATE HFA 108 (90 BASE) MCG/ACT IN AERS: 2.0000 | INHALATION_SPRAY | Freq: Four times a day (QID) | RESPIRATORY_TRACT | Status: AC | PRN

## 2014-09-17 NOTE — Progress Notes (Signed)
I was directly involved with the patient's care and agree with the physical, diagnosis and treatment plan.  

## 2014-09-17 NOTE — Progress Notes (Signed)
Subjective:    Patient ID: Shannon Mclean, male    DOB: 1966-04-21, 48 y.o.   MRN: [id number]  HPI Patient present to clinic for annual physical exam and is concerned that he is beginning to lose his memory and that it maybe dementia since his mother was diagnosed when she was 47 yrs old with Alzheimers. This bothers him because he is an Personnel officerelectrician and is worried that his forgetfulness and decreased focus will put himself and coworkers in danger. He has taken a sabbatical from work out of fear that he may be viewed as unfit to do his job which he feels he is capable of. [facility] and neurologist. A CAT scan was ordered possibly for the eval of dementia, but he missed his appointment and was wondering if we could get him a referral before his coming neuro appt this month (10/22). [facility] him on amitriptyline 25 mg x1week ago for depression and he describes his mood as great (while grimacing). His mother died 2013 and his brother died [id number]. Does not feel like he has closure for brother's death and is still dealing with grief for both. States that additionally work focus and lack of sleep have increased his stress and anxiety at work.  Has been having difficulty with urination for a few months. Difficulty getting started, has weak stream, incomplete emptying, and feels has to really push to get urine out. Symptoms are not progressing. Denies new sexual partners, frequency, burning/pain with urination, or back/flank pain.    Describes his diet as meat based and mostly simmers/fries his food. Does not eat much vegetables and eats some fruits. He drinks about 5 bottles of water per day and denies soda, coffee, or tea. Not as active as before.    Review of Systems  Constitutional: Positive for activity change and fatigue. Negative for fever and appetite change.  HENT: Negative for congestion, rhinorrhea, sinus pressure, sneezing and sore  throat.   Eyes: Negative for discharge, itching and visual disturbance.  Respiratory: Negative for cough, chest tightness, shortness of breath and wheezing.   Cardiovascular: Negative for chest pain, palpitations and leg swelling.  Gastrointestinal: Negative for nausea, vomiting, abdominal pain, diarrhea and constipation.  Genitourinary: Positive for decreased urine volume and difficulty urinating. Negative for urgency, frequency, hematuria, flank pain, discharge and enuresis.  Musculoskeletal: Negative for arthralgias, back pain and myalgias.  Skin: Negative for rash.  Allergic/Immunologic: Positive for environmental allergies. Negative for food allergies.  Neurological: Negative for dizziness, weakness, light-headedness and headaches.  Psychiatric/Behavioral: Positive for confusion, sleep disturbance and decreased concentration. Negative for suicidal ideas, dysphoric mood and agitation. The patient is nervous/anxious.        Objective:   Physical Exam  Constitutional: He is oriented to person, place, and time. He appears well-developed and well-nourished. No distress.  HENT:  Head: Normocephalic and atraumatic.  Right Ear: External ear normal.  Left Ear: External ear normal.  Nose: Nose normal.  Mouth/Throat: Oropharynx is clear and moist. No oropharyngeal exudate.  Eyes: Conjunctivae and EOM are normal. Pupils are equal, round, and reactive to light. Right eye exhibits no discharge. Left eye exhibits no discharge. No scleral icterus.  Neck: Neck supple. No JVD present. No thyromegaly present.  Cardiovascular: Normal rate, regular rhythm and normal heart sounds.  Exam reveals no gallop and no friction rub.   No murmur heard. Pulmonary/Chest: Effort normal and breath sounds normal. No stridor. No  respiratory distress. He has no wheezes. He has no rales. He exhibits no tenderness.  Abdominal: Soft. Bowel sounds are normal. He exhibits no distension and no mass. There is no tenderness.  Hernia confirmed negative in the right inguinal area and confirmed negative in the left inguinal area.  Genitourinary: Testes normal. Rectal exam shows no external hemorrhoid, no mass, no tenderness and anal tone normal. Prostate is enlarged. Prostate is not tender. Right testis shows no mass, no swelling and no tenderness. Left testis shows no mass, no swelling and no tenderness. Circumcised. No phimosis, paraphimosis, hypospadias, penile erythema or penile tenderness. No discharge found.  Musculoskeletal: Normal range of motion. He exhibits no edema and no tenderness.  Lymphadenopathy:    He has no cervical adenopathy.       Right: No inguinal adenopathy present.       Left: No inguinal adenopathy present.  Neurological: He is alert and oriented to person, place, and time. He has normal reflexes.  Skin: Skin is warm and dry. No rash noted. He is not diaphoretic. No erythema. No pallor.  Psychiatric: His mood appears anxious. He exhibits a depressed mood.  Lack of eye contact. Did not always answer questions clearly.   Blood pressure 146/80, pulse 78, temperature 97.4 F (36.3 C), temperature source Oral, resp. rate 16, height 5\' 11"  (1.803 m), weight 235 lb 12.8 oz (106.958 kg), SpO2 99.00%.  Results for orders placed in visit on 09/17/14  POCT CBC      Result Value Ref Range   WBC 6.4  4.6 - 10.2 K/uL   Lymph, poc 2.2  0.6 - 3.4   POC LYMPH PERCENT 34.2  10 - 50 %L   MID (cbc) 0.3  0 - 0.9   POC MID % 5.2  0 - 12 %M   POC Granulocyte 3.89  2 - 6.9   Granulocyte percent 60.6  37 - 80 %G   RBC 5.50  4.69 - 6.13 M/uL   Hemoglobin 15.1  14.1 - 18.1 g/dL   HCT, POC 16.1  09.6 - 53.7 %   MCV 86.2  80 - 97 fL   MCH, POC 27.5  27 - 31.2 pg   MCHC 31.9  31.8 - 35.4 g/dL   RDW, POC 04.5     Platelet Count, POC 128 (*) 142 - 424 K/uL   MPV 9.9  0 - 99.8 fL  POCT URINALYSIS DIPSTICK      Result Value Ref Range   Color, UA yellow     Clarity, UA clear     Glucose, UA neg     Bilirubin,  UA neg     Ketones, UA neg     Spec Grav, UA 1.020     Blood, UA neg     pH, UA 5.5     Protein, UA neg     Urobilinogen, UA 0.2     Nitrite, UA neg     Leukocytes, UA Negative    POCT UA - MICROSCOPIC ONLY      Result Value Ref Range   WBC, Ur, HPF, POC neg     RBC, urine, microscopic neg     Bacteria, U Microscopic neg     Mucus, UA neg     Epithelial cells, urine per micros neg     Crystals, Ur, HPF, POC neg     Casts, Ur, LPF, POC neg     Yeast, UA neg         Assessment &  Plan:  1. Annual physical exam - POCT CBC - POCT urinalysis dipstick - POCT UA - Microscopic Only - Comprehensive metabolic panel - GC/Chlamydia Probe Amp - Lipid panel - PSA - TSH - HSV(herpes simplex vrs) 1+2 ab-IgG - RPR - HIV antibody  2. Weak urine stream 3. Enlarged prostate Concerned for possible BPH. - PSA - Urology referral.  4. Asthma, chronic, mild intermittent, uncomplicated Currently controlled. - albuterol (PROVENTIL HFA;VENTOLIN HFA) 108 (90 BASE) MCG/ACT inhaler; Inhale 2 puffs into the lungs every 6 (six) hours as needed for wheezing or shortness of breath.  Dispense: 18 g; Refill: 0  5. Depression with anxiety 6. Memory difficulties Spent a lengthy amount of time discussing depression and difficulties with memory. [facility]. On Amitriptyline for 1 week. Plans on calling psychologist to increase dose. Psych follow up appt scheduled for 10/2014. Neuro appt 09/2014. Explained to patient these are the appropriate individuals to address memory, CT scans, and depression/anxiety.     Janan Ridgeishira Jakala Herford PA-C  [facility] 09/17/2014 7:39 PM

## 2014-09-18 LAB — HSV(HERPES SIMPLEX VRS) I + II AB-IGG
HSV 1 GLYCOPROTEIN G AB, IGG: 0.36 IV
HSV 2 GLYCOPROTEIN G AB, IGG: 5.15 IV — AB

## 2014-09-18 LAB — HIV ANTIBODY (ROUTINE TESTING W REFLEX): HIV 1&2 Ab, 4th Generation: NONREACTIVE

## 2014-09-18 LAB — PSA: PSA: 0.71 ng/mL (ref <=4.00)

## 2014-09-18 LAB — RPR

## 2014-09-18 LAB — GC/CHLAMYDIA PROBE AMP
CT PROBE, AMP APTIMA: NEGATIVE
GC PROBE AMP APTIMA: NEGATIVE

## 2014-09-19 ENCOUNTER — Telehealth: Payer: Self-pay | Admitting: Physician Assistant

## 2014-09-19 NOTE — Telephone Encounter (Signed)
Reviewed labs with patient. AST elevated, advised patient to refrain from alcohol and RTC in 8 weeks for recheck. Cholesterol also elevated. Will not start statin at this time due to elevated AST. Patient understands results and agrees to RTC. He adds that he spoke with his psychiatrist and had increased amitriptyline to 50 mg.   Janan Ridgeishira Micheale Schlack, PA-C

## 2014-10-07 ENCOUNTER — Other Ambulatory Visit: Payer: Self-pay

## 2014-10-07 DIAGNOSIS — J302 Other seasonal allergic rhinitis: Secondary | ICD-10-CM

## 2014-10-07 MED ORDER — LEVOCETIRIZINE DIHYDROCHLORIDE 5 MG PO TABS: 5.0000 mg | ORAL_TABLET | Freq: Every evening | ORAL | Status: AC

## 2015-05-02 ENCOUNTER — Other Ambulatory Visit: Payer: Self-pay | Admitting: Physician Assistant

## 2015-05-10 ENCOUNTER — Other Ambulatory Visit: Payer: Self-pay | Admitting: Family Medicine

## 2015-07-28 ENCOUNTER — Encounter: Payer: Self-pay | Admitting: *Deleted

## 2015-12-25 ENCOUNTER — Other Ambulatory Visit: Payer: Self-pay | Admitting: Physician Assistant
